# Patient Record
Sex: Female | Born: 1945 | Race: Black or African American | Hispanic: No | Marital: Single | Smoking: Never smoker
Health system: Southern US, Community
[De-identification: ages and names within clinical notes are randomized; demographics above are authoritative.]

## PROBLEM LIST (undated history)

## (undated) DIAGNOSIS — M5030 Other cervical disc degeneration, unspecified cervical region: Secondary | ICD-10-CM

## (undated) DIAGNOSIS — I82409 Acute embolism and thrombosis of unspecified deep veins of unspecified lower extremity: Secondary | ICD-10-CM

## (undated) DIAGNOSIS — M329 Systemic lupus erythematosus, unspecified: Secondary | ICD-10-CM

## (undated) DIAGNOSIS — M4800 Spinal stenosis, site unspecified: Secondary | ICD-10-CM

## (undated) DIAGNOSIS — M797 Fibromyalgia: Secondary | ICD-10-CM

## (undated) DIAGNOSIS — I10 Essential (primary) hypertension: Secondary | ICD-10-CM

## (undated) DIAGNOSIS — IMO0002 Reserved for concepts with insufficient information to code with codable children: Secondary | ICD-10-CM

## (undated) DIAGNOSIS — G8929 Other chronic pain: Secondary | ICD-10-CM

## (undated) DIAGNOSIS — M4820 Kissing spine, site unspecified: Secondary | ICD-10-CM

---

## 2001-09-27 ENCOUNTER — Emergency Department (HOSPITAL_COMMUNITY): Admission: EM | Admit: 2001-09-27 | Discharge: 2001-09-27 | Payer: Self-pay | Admitting: Emergency Medicine

## 2019-06-23 ENCOUNTER — Ambulatory Visit: Payer: Self-pay

## 2019-07-01 ENCOUNTER — Ambulatory Visit: Payer: Medicare Other | Attending: Internal Medicine

## 2019-07-01 DIAGNOSIS — Z23 Encounter for immunization: Secondary | ICD-10-CM

## 2019-07-01 NOTE — Progress Notes (Signed)
   Covid-19 Vaccination Clinic  Name:  Alexandra James    MRN: [id number] DOB: March 06, 1946  07/01/2019  Alexandra James was observed post Covid-19 immunization for 15 minutes without incidence. She was provided with Vaccine Information Sheet and instruction to access the V-Safe system.   Alexandra James was instructed to call 911 with any severe reactions post vaccine: Marland Kitchen Difficulty breathing  . Swelling of your face and throat  . A fast heartbeat  . A bad rash all over your body  . Dizziness and weakness    Immunizations Administered    Name Date Dose VIS Date Route   Pfizer COVID-19 Vaccine 07/01/2019  2:51 PM 0.3 mL 05/05/2019 Intramuscular   Manufacturer: [employer], [employer]   Lot: [id number]   NDC: [address]-5

## 2019-07-14 ENCOUNTER — Ambulatory Visit: Payer: Self-pay

## 2019-07-26 ENCOUNTER — Ambulatory Visit: Payer: Medicare Other | Attending: Internal Medicine

## 2019-07-26 DIAGNOSIS — Z23 Encounter for immunization: Secondary | ICD-10-CM | POA: Insufficient documentation

## 2019-07-26 NOTE — Progress Notes (Signed)
   Covid-19 Vaccination Clinic  Name:  Alexandra James    MRN: [id number] DOB: April 09, 1946  07/26/2019  [name] was observed post Covid-19 immunization for 15 minutes without incident. She was provided with Vaccine Information Sheet and instruction to access the V-Safe system.   [name] was instructed to call 911 with any severe reactions post vaccine: Marland Kitchen Difficulty breathing  . Swelling of face and throat  . A fast heartbeat  . A bad rash all over body  . Dizziness and weakness   Immunizations Administered    Name Date Dose VIS Date Route   Pfizer COVID-19 Vaccine 07/26/2019 10:51 AM 0.3 mL 05/05/2019 Intramuscular   Manufacturer: [employer], [employer]   Lot: [id number]   NDC: [address]-1

## 2020-09-06 ENCOUNTER — Emergency Department (HOSPITAL_BASED_OUTPATIENT_CLINIC_OR_DEPARTMENT_OTHER): Payer: Medicare Other | Admitting: Radiology

## 2020-09-06 ENCOUNTER — Emergency Department (HOSPITAL_BASED_OUTPATIENT_CLINIC_OR_DEPARTMENT_OTHER): Payer: Medicare Other

## 2020-09-06 ENCOUNTER — Other Ambulatory Visit: Payer: Self-pay

## 2020-09-06 ENCOUNTER — Encounter (HOSPITAL_BASED_OUTPATIENT_CLINIC_OR_DEPARTMENT_OTHER): Payer: Self-pay | Admitting: Emergency Medicine

## 2020-09-06 ENCOUNTER — Emergency Department (HOSPITAL_BASED_OUTPATIENT_CLINIC_OR_DEPARTMENT_OTHER)
Admission: EM | Admit: 2020-09-06 | Discharge: 2020-09-07 | Disposition: A | Discharge status: Home / Self Care | Payer: Medicare Other | Attending: Emergency Medicine | Admitting: Emergency Medicine

## 2020-09-06 DIAGNOSIS — I10 Essential (primary) hypertension: Secondary | ICD-10-CM | POA: Insufficient documentation

## 2020-09-06 DIAGNOSIS — W07XXXA Fall from chair, initial encounter: Secondary | ICD-10-CM | POA: Diagnosis not present

## 2020-09-06 DIAGNOSIS — M797 Fibromyalgia: Secondary | ICD-10-CM | POA: Diagnosis not present

## 2020-09-06 DIAGNOSIS — M79651 Pain in right thigh: Secondary | ICD-10-CM | POA: Insufficient documentation

## 2020-09-06 DIAGNOSIS — R0789 Other chest pain: Secondary | ICD-10-CM | POA: Diagnosis not present

## 2020-09-06 DIAGNOSIS — M545 Low back pain, unspecified: Secondary | ICD-10-CM | POA: Diagnosis not present

## 2020-09-06 DIAGNOSIS — W19XXXA Unspecified fall, initial encounter: Secondary | ICD-10-CM

## 2020-09-06 DIAGNOSIS — R600 Localized edema: Secondary | ICD-10-CM | POA: Insufficient documentation

## 2020-09-06 LAB — BASIC METABOLIC PANEL
Anion gap: 10 (ref 5–15)
BUN: 33 mg/dL — ABNORMAL HIGH (ref 8–23)
CO2: 28 mmol/L (ref 22–32)
Calcium: 9.6 mg/dL (ref 8.9–10.3)
Chloride: 103 mmol/L (ref 98–111)
Creatinine, Ser: 1.64 mg/dL — ABNORMAL HIGH (ref 0.44–1.00)
GFR, Estimated: 33 mL/min — ABNORMAL LOW (ref >60)
Glucose, Bld: 90 mg/dL (ref 70–99)
Potassium: 3.6 mmol/L (ref 3.5–5.1)
Sodium: 141 mmol/L (ref 135–145)

## 2020-09-06 LAB — CBC
HCT: 36.9 % (ref 36.0–46.0)
Hemoglobin: 12.3 g/dL (ref 12.0–15.0)
MCH: 29.1 pg (ref 26.0–34.0)
MCHC: 33.3 g/dL (ref 30.0–36.0)
MCV: 87.4 fL (ref 80.0–100.0)
Platelets: 146 10*3/uL — ABNORMAL LOW (ref 150–400)
RBC: 4.22 MIL/uL (ref 3.87–5.11)
RDW: 14.7 % (ref 11.5–15.5)
WBC: 5.2 10*3/uL (ref 4.0–10.5)
nRBC: 0 % (ref 0.0–0.2)

## 2020-09-06 LAB — PROTIME-INR
INR: 1.1 (ref 0.8–1.2)
Prothrombin Time: 14 seconds (ref 11.4–15.2)

## 2020-09-06 LAB — TROPONIN I (HIGH SENSITIVITY): Troponin I (High Sensitivity): 3 ng/L (ref <18)

## 2020-09-06 LAB — CBG MONITORING, ED: Glucose-Capillary: 85 mg/dL (ref 70–99)

## 2020-09-06 MED ORDER — ASPIRIN 81 MG PO CHEW
324.0000 mg | CHEWABLE_TABLET | Freq: Once | ORAL | Status: AC
  Administered 2020-09-06: 324 mg via ORAL
  Filled 2020-09-06: qty 4

## 2020-09-06 MED ORDER — HYDROCODONE-ACETAMINOPHEN 5-325 MG PO TABS
1.0000 | ORAL_TABLET | Freq: Once | ORAL | Status: AC
  Administered 2020-09-06: 1 via ORAL
  Filled 2020-09-06: qty 1

## 2020-09-06 NOTE — ED Provider Notes (Signed)
Care of the patient assumed at the change of shift. She is her after a fall, also having an uneasy feeling in her chest. Low back pain after the fall, now also complaining of R thigh pain, worse with movement Physical Exam  BP (!) 114/47 (BP Location: Left Arm)   Pulse 72   Temp 98.1 F (36.7 C) (Oral)   Resp 17   Wt 100 kg   SpO2 100%   Physical Exam FROM of R leg, no bony tenderness.  ED Course/Procedures     Procedures  MDM  L spine and CXR neg for acute process or injury. Will give a dose of pain medications while awaiting delta Trop  12:39 AM Patient reports pain improved. Delta Trop is neg. Plan discharge with outpatient follow up. APAP if needed for pain.        Pollyann Savoy, MD 09/07/20 0040

## 2020-09-06 NOTE — ED Notes (Signed)
Patient transported to X-ray 

## 2020-09-06 NOTE — ED Triage Notes (Signed)
Pt fell while attempting to sit in her chair and it slipped backwards. Pt is c/o back pain and left sided chest "uneasiness". Pt does not think it his her heart but more of her rib cage.   Rates back pain 1/10. Denies LOC or hitting her head.

## 2020-09-06 NOTE — ED Notes (Signed)
Pt is c/o right leg pain, will notify MD.

## 2020-09-06 NOTE — ED Provider Notes (Signed)
[facility] EMERGENCY DEPT Provider Note   CSN: [id number] Arrival date & time: 09/06/20  2151     History Chief Complaint  Patient presents with  . Fall  . Chest Pain    Alexandra James is a 75 y.o. female.  HPI   Patient states she was attempting to sit in a chair when it slipped backwards and she ended up falling.  Patient states after the fall she started having some pain in her lower back.  She also has had an uneasy sensation in her chest.  Patient does not describe it as a pain.  She is not feeling short of breath.  She gestures with her hand a pinching twisting type of motion.  She did not hit her head.  She did not lose consciousness.  With the discomfort in her chest she figured she should come and get checked out.  Patient does not have a history of heart disease but she does have a history of lupus.  She also does have history of some chronic leg swelling  Past medical history: Chronic pain, degenerative disc disease, depression, DVT, fibromyalgia, hypertension, lupus, spinal stenosis    History reviewed. No pertinent surgical history.   OB History   No obstetric history on file.     History reviewed. No pertinent family history.     Home Medications Prior to Admission medications   Not on File    Allergies    Clindamycin/lincomycin, Penicillins, and Amoxicillin  Review of Systems   Review of Systems  All other systems reviewed and are negative.   Physical Exam Updated Vital Signs BP 137/79   Pulse 79   Temp 98.1 F (36.7 C) (Oral)   Resp 18   Wt 100 kg   SpO2 100%   Physical Exam Vitals and nursing note reviewed.  Constitutional:      General: She is not in acute distress.    Appearance: She is well-developed.  HENT:     Head: Normocephalic and atraumatic.     Right Ear: External ear normal.     Left Ear: External ear normal.  Eyes:     General: No scleral icterus.       Right eye: No discharge.        Left eye: No  discharge.     Conjunctiva/sclera: Conjunctivae normal.  Neck:     Trachea: No tracheal deviation.  Cardiovascular:     Rate and Rhythm: Normal rate and regular rhythm.  Pulmonary:     Effort: Pulmonary effort is normal. No respiratory distress.     Breath sounds: Normal breath sounds. No stridor. No wheezing or rales.  Abdominal:     General: Bowel sounds are normal. There is no distension.     Palpations: Abdomen is soft.     Tenderness: There is no abdominal tenderness. There is no guarding or rebound.  Musculoskeletal:     Cervical back: Normal and neck supple.     Thoracic back: Normal.     Lumbar back: Tenderness present.     Right lower leg: No tenderness. Edema present.     Left lower leg: No tenderness. Edema present.  Skin:    General: Skin is warm and dry.     Findings: No rash.  Neurological:     Mental Status: She is alert.     Cranial Nerves: No cranial nerve deficit (no facial droop, extraocular movements intact, no slurred speech).     Sensory: No sensory deficit.  Motor: No abnormal muscle tone or seizure activity.     Coordination: Coordination normal.     ED Results / Procedures / Treatments   Labs (all labs ordered are listed, but only abnormal results are displayed) Labs Reviewed  BASIC METABOLIC PANEL  CBC  CBG MONITORING, ED  TROPONIN I (HIGH SENSITIVITY)    EKG None  Radiology No results found.  Procedures Procedures   Medications Ordered in ED Medications  aspirin chewable tablet 324 mg (has no administration in time range)    ED Course  I have reviewed the triage vital signs and the nursing notes.  Pertinent labs & imaging results that were available during my care of the patient were reviewed by me and considered in my medical decision making (see chart for details).    MDM Rules/Calculators/A&P                          Pt with complaints of fall, uneasy feeling in chest.  Sx atypical for ACS.  No signs of acute infection.   Mild ttp on exam.  Labs xrays ordered.   Care turned over to Dr Bernette Mayers at shift change.   Linwood Dibbles, MD 09/08/20 [phone]

## 2020-09-07 LAB — TROPONIN I (HIGH SENSITIVITY): Troponin I (High Sensitivity): 4 ng/L (ref <18)

## 2021-05-09 ENCOUNTER — Emergency Department (HOSPITAL_BASED_OUTPATIENT_CLINIC_OR_DEPARTMENT_OTHER)
Admission: EM | Admit: 2021-05-09 | Discharge: 2021-05-09 | Disposition: A | Discharge status: Home / Self Care | Payer: Medicare Other | Attending: Emergency Medicine | Admitting: Emergency Medicine

## 2021-05-09 ENCOUNTER — Emergency Department (HOSPITAL_BASED_OUTPATIENT_CLINIC_OR_DEPARTMENT_OTHER): Payer: Medicare Other | Admitting: Radiology

## 2021-05-09 ENCOUNTER — Other Ambulatory Visit: Payer: Self-pay

## 2021-05-09 ENCOUNTER — Encounter (HOSPITAL_BASED_OUTPATIENT_CLINIC_OR_DEPARTMENT_OTHER): Payer: Self-pay | Admitting: *Deleted

## 2021-05-09 DIAGNOSIS — I10 Essential (primary) hypertension: Secondary | ICD-10-CM | POA: Diagnosis not present

## 2021-05-09 DIAGNOSIS — M25512 Pain in left shoulder: Secondary | ICD-10-CM | POA: Diagnosis not present

## 2021-05-09 DIAGNOSIS — M791 Myalgia, unspecified site: Secondary | ICD-10-CM | POA: Insufficient documentation

## 2021-05-09 DIAGNOSIS — R519 Headache, unspecified: Secondary | ICD-10-CM | POA: Diagnosis not present

## 2021-05-09 DIAGNOSIS — R0789 Other chest pain: Secondary | ICD-10-CM | POA: Diagnosis not present

## 2021-05-09 DIAGNOSIS — M542 Cervicalgia: Secondary | ICD-10-CM | POA: Diagnosis not present

## 2021-05-09 DIAGNOSIS — N289 Disorder of kidney and ureter, unspecified: Secondary | ICD-10-CM | POA: Insufficient documentation

## 2021-05-09 DIAGNOSIS — R079 Chest pain, unspecified: Secondary | ICD-10-CM | POA: Diagnosis present

## 2021-05-09 HISTORY — DX: Essential (primary) hypertension: I10

## 2021-05-09 HISTORY — DX: Fibromyalgia: M79.7

## 2021-05-09 HISTORY — DX: Systemic lupus erythematosus, unspecified: M32.9

## 2021-05-09 HISTORY — DX: Other chronic pain: G89.29

## 2021-05-09 HISTORY — DX: Acute embolism and thrombosis of unspecified deep veins of unspecified lower extremity: I82.409

## 2021-05-09 HISTORY — DX: Reserved for concepts with insufficient information to code with codable children: IMO0002

## 2021-05-09 HISTORY — DX: Kissing spine, site unspecified: M48.20

## 2021-05-09 HISTORY — DX: Other cervical disc degeneration, unspecified cervical region: M50.30

## 2021-05-09 HISTORY — DX: Spinal stenosis, site unspecified: M48.00

## 2021-05-09 LAB — CBC WITH DIFFERENTIAL/PLATELET
Abs Immature Granulocytes: 0.03 10*3/uL (ref 0.00–0.07)
Basophils Absolute: 0 10*3/uL (ref 0.0–0.1)
Basophils Relative: 0 %
Eosinophils Absolute: 0 10*3/uL (ref 0.0–0.5)
Eosinophils Relative: 0 %
HCT: 40.3 % (ref 36.0–46.0)
Hemoglobin: 13 g/dL (ref 12.0–15.0)
Immature Granulocytes: 1 %
Lymphocytes Relative: 16 %
Lymphs Abs: 1 10*3/uL (ref 0.7–4.0)
MCH: 28.6 pg (ref 26.0–34.0)
MCHC: 32.3 g/dL (ref 30.0–36.0)
MCV: 88.6 fL (ref 80.0–100.0)
Monocytes Absolute: 0.5 10*3/uL (ref 0.1–1.0)
Monocytes Relative: 8 %
Neutro Abs: 4.7 10*3/uL (ref 1.7–7.7)
Neutrophils Relative %: 75 %
Platelets: 184 10*3/uL (ref 150–400)
RBC: 4.55 MIL/uL (ref 3.87–5.11)
RDW: 14.4 % (ref 11.5–15.5)
WBC: 6.3 10*3/uL (ref 4.0–10.5)
nRBC: 0 % (ref 0.0–0.2)

## 2021-05-09 LAB — COMPREHENSIVE METABOLIC PANEL
ALT: 18 U/L (ref 0–44)
AST: 19 U/L (ref 15–41)
Albumin: 4.3 g/dL (ref 3.5–5.0)
Alkaline Phosphatase: 105 U/L (ref 38–126)
Anion gap: 10 (ref 5–15)
BUN: 22 mg/dL (ref 8–23)
CO2: 29 mmol/L (ref 22–32)
Calcium: 9.9 mg/dL (ref 8.9–10.3)
Chloride: 101 mmol/L (ref 98–111)
Creatinine, Ser: 1.39 mg/dL — ABNORMAL HIGH (ref 0.44–1.00)
GFR, Estimated: 40 mL/min — ABNORMAL LOW (ref >60)
Glucose, Bld: 149 mg/dL — ABNORMAL HIGH (ref 70–99)
Potassium: 3.8 mmol/L (ref 3.5–5.1)
Sodium: 140 mmol/L (ref 135–145)
Total Bilirubin: 0.8 mg/dL (ref 0.3–1.2)
Total Protein: 7.9 g/dL (ref 6.5–8.1)

## 2021-05-09 LAB — TROPONIN I (HIGH SENSITIVITY)
Troponin I (High Sensitivity): 3 ng/L (ref <18)
Troponin I (High Sensitivity): 3 ng/L (ref <18)

## 2021-05-09 LAB — LIPASE, BLOOD: Lipase: 15 U/L (ref 11–51)

## 2021-05-09 MED ORDER — DIPHENHYDRAMINE HCL 50 MG/ML IJ SOLN
25.0000 mg | Freq: Once | INTRAMUSCULAR | Status: AC
  Administered 2021-05-09: 25 mg via INTRAVENOUS
  Filled 2021-05-09: qty 1

## 2021-05-09 MED ORDER — LACTATED RINGERS IV BOLUS
1000.0000 mL | Freq: Once | INTRAVENOUS | Status: AC
  Administered 2021-05-09: 1000 mL via INTRAVENOUS

## 2021-05-09 MED ORDER — ASPIRIN 81 MG PO CHEW
324.0000 mg | CHEWABLE_TABLET | Freq: Once | ORAL | Status: AC
  Administered 2021-05-09: 324 mg via ORAL
  Filled 2021-05-09: qty 4

## 2021-05-09 MED ORDER — PROCHLORPERAZINE EDISYLATE 10 MG/2ML IJ SOLN
10.0000 mg | Freq: Once | INTRAMUSCULAR | Status: AC
  Administered 2021-05-09: 10 mg via INTRAVENOUS
  Filled 2021-05-09: qty 2

## 2021-05-09 NOTE — ED Triage Notes (Signed)
Pt reports left "stiff neck", headache, left shoulder blade, back and "like a band" around the chest discomfort. Reports pain started last night before going to bed.

## 2021-05-09 NOTE — Discharge Instructions (Signed)
Take acetaminophen and/or ibuprofen as needed for pain.  Make sure to drink enough fluids.  Return to the emergency department if you are having any problems.

## 2021-05-09 NOTE — ED Notes (Signed)
After triage, pt says she is feeling like needs to vomit. Gave her emesis bag, pt vomited approx emesis. Provider at the bedside.

## 2021-05-09 NOTE — ED Provider Notes (Signed)
[facility] EMERGENCY DEPT Provider Note   CSN: [id number] Arrival date & time: 05/09/21  0401     History Chief Complaint  Patient presents with   Chest Pain    Alexandra James is a 75 y.o. female.  The history is provided by the patient.  Chest Pain She has history of hypertension, lupus and comes in because of chest pain as well as neck and shoulder pain and headache.  She was feeling well until this evening when she just had general malaise.  She thought she may have had some indigestion because she had over eaten.  She then gradually developed a global headache, has some pain in her neck and left shoulder and a tight feeling in her chest.  There is no associated dyspnea or diaphoresis.  After arrival in the ED, she had nausea and vomited a large amount.  Following this, she is feeling somewhat better.  Pain was rated at 8/10 at its worst, 5/10 currently.  She has never had anything like this.  She is a non-smoker and without a history of diabetes or hyperlipidemia.   Past Medical History:  Diagnosis Date   Baastrup's syndrome    DDD (degenerative disc disease), cervical    DVT (deep venous thrombosis) (HCC)    Hypertension    Lupus (HCC)    Spinal stenosis     There are no problems to display for this patient.   History reviewed. No pertinent surgical history.   OB History   No obstetric history on file.     No family history on file.     Home Medications Prior to Admission medications   Not on File    Allergies    Clindamycin/lincomycin, Penicillins, and Amoxicillin  Review of Systems   Review of Systems  Cardiovascular:  Positive for chest pain.  All other systems reviewed and are negative.  Physical Exam Updated Vital Signs BP (!) 143/69 (BP Location: Right Arm)    Pulse 76    Temp 97.6 F (36.4 C) (Oral)    Resp (!) 22    Ht 5\' 4"  (1.626 m)    Wt 99.8 kg    SpO2 100%    BMI 37.76 kg/m   Physical Exam Vitals and nursing note  reviewed.  75 year old female, resting comfortably and in no acute distress. Vital signs are significant for slightly elevated respiratory rate, and borderline elevated blood pressure. Oxygen saturation is 100%, which is normal. Head is normocephalic and atraumatic. PERRLA, EOMI. Oropharynx is clear. Neck is nontender and supple without adenopathy or JVD. Back is nontender and there is no CVA tenderness. Lungs are clear without rales, wheezes, or rhonchi. Chest is nontender. Heart has regular rate and rhythm without murmur. Abdomen is soft, flat, nontender without masses or hepatosplenomegaly and peristalsis is hypoactive. Extremities have 1+ edema, full range of motion is present. Skin is warm and dry without rash. Neurologic: Mental status is normal, cranial nerves are intact, moves all extremities equally.  ED Results / Procedures / Treatments   Labs (all labs ordered are listed, but only abnormal results are displayed) Labs Reviewed  COMPREHENSIVE METABOLIC PANEL - Abnormal; Notable for the following components:      Result Value   Glucose, Bld 149 (*)    Creatinine, Ser 1.39 (*)    GFR, Estimated 40 (*)    All other components within normal limits  LIPASE, BLOOD  CBC WITH DIFFERENTIAL/PLATELET  TROPONIN I (HIGH SENSITIVITY)  TROPONIN I (HIGH SENSITIVITY)  EKG EKG Interpretation  Date/Time:  Friday May 09 2021 04:14:43 EST Ventricular Rate:  78 PR Interval:  219 QRS Duration: 101 QT Interval:  431 QTC Calculation: 491 R Axis:   -45 Text Interpretation: Sinus rhythm Borderline prolonged PR interval Left anterior fascicular block Low voltage, precordial leads Consider anterior infarct When compared with ECG of 09/06/2020, No significant change was found Confirmed by Dione Booze ([id number]) on 05/09/2021 4:23:18 AM  Radiology DG Chest 2 View  Result Date: 05/09/2021 CLINICAL DATA:  75 year old female with history of chest pain. EXAM: CHEST - 2 VIEW COMPARISON:  Chest  x-ray 09/06/2020. FINDINGS: Lung volumes are low. There are bibasilar opacities (left greater than right), most compatible with areas of subsegmental atelectasis. No acute consolidative airspace disease. No definite pleural effusions. No pneumothorax. No evidence of pulmonary edema. Heart size is normal. The patient is rotated to the left on today's exam, resulting in distortion of the mediastinal contours and reduced diagnostic sensitivity and specificity for mediastinal pathology. IMPRESSION: 1. Low lung volumes with bibasilar (left greater than right) areas of subsegmental atelectasis. Electronically Signed   By: Trudie Reed M.D.   On: 05/09/2021 05:08    Procedures Procedures   Medications Ordered in ED Medications  lactated ringers bolus 1,000 mL (0 mLs Intravenous Stopped 05/09/21 0602)  prochlorperazine (COMPAZINE) injection 10 mg (10 mg Intravenous Given 05/09/21 0501)  diphenhydrAMINE (BENADRYL) injection 25 mg (25 mg Intravenous Given 05/09/21 0505)  aspirin chewable tablet 324 mg (324 mg Oral Given 05/09/21 0505)    ED Course  I have reviewed the triage vital signs and the nursing notes.  Pertinent labs & imaging results that were available during my care of the patient were reviewed by me and considered in my medical decision making (see chart for details).   MDM Rules/Calculators/A&P                         Chest pain as well as associated neck pain and shoulder pain and headache.  Chest pain is concerning for possible angina or ACS, but other symptoms are not typical of ACS.  ECG shows no acute changes.  Old records are reviewed, and she has no relevant past visits.  We will check chest x-ray and screening labs.  She will be given IV fluids, prochlorperazine, diphenhydramine as well as aspirin and reassessed.  Patient risk score per heart pathway is for which puts her at an elevated risk of major adverse cardiac events, but not a high risk.  Chest x-ray shows probable  atelectasis.  Labs are significant for renal insufficiency which is actually improved compared with baseline.  Following above-noted treatment, she had complete relief of symptoms.  Troponin is normal x2, she is felt to be safe for discharge with follow-up with primary care provider.  Final Clinical Impression(s) / ED Diagnoses Final diagnoses:  Bad headache  Myalgia  Renal insufficiency    Rx / DC Orders ED Discharge Orders     None        Dione Booze, MD 05/09/21 [id number]

## 2021-05-09 NOTE — ED Notes (Signed)
Patient transported to X-ray 

## 2021-09-03 IMAGING — DX DG LUMBAR SPINE COMPLETE 4+V
5 series · 5 of 5 positions shown · non-contrast
Comparison: None.

CLINICAL DATA: Recent fall with left-sided back and chest pain,
initial encounter

EXAM:
LUMBAR SPINE - COMPLETE 4+ VIEW

[l-spine ap]
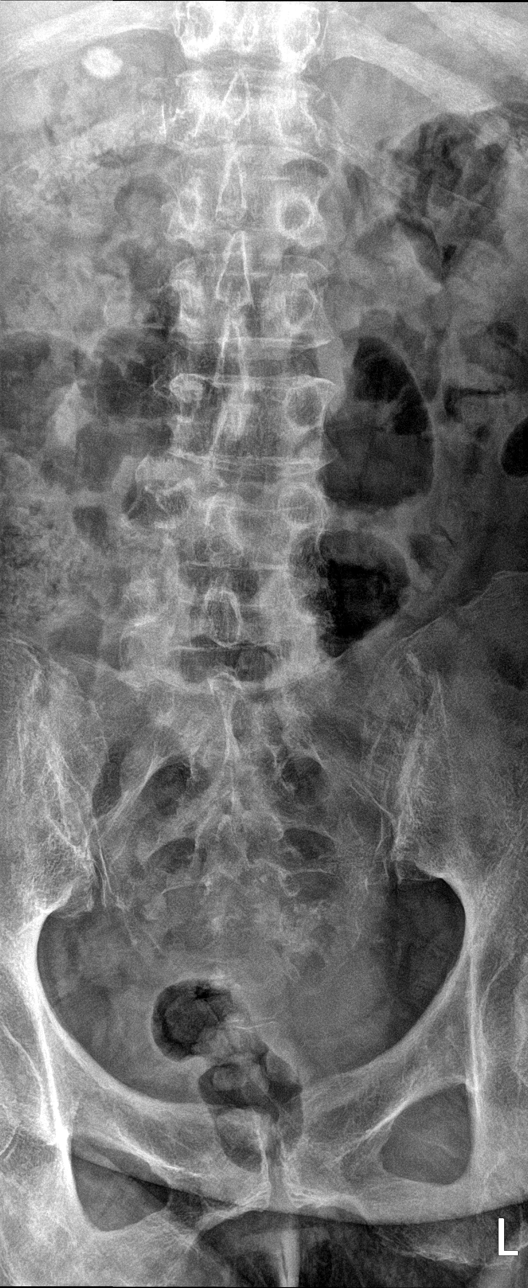

[l-spine obl (1 of 2)]
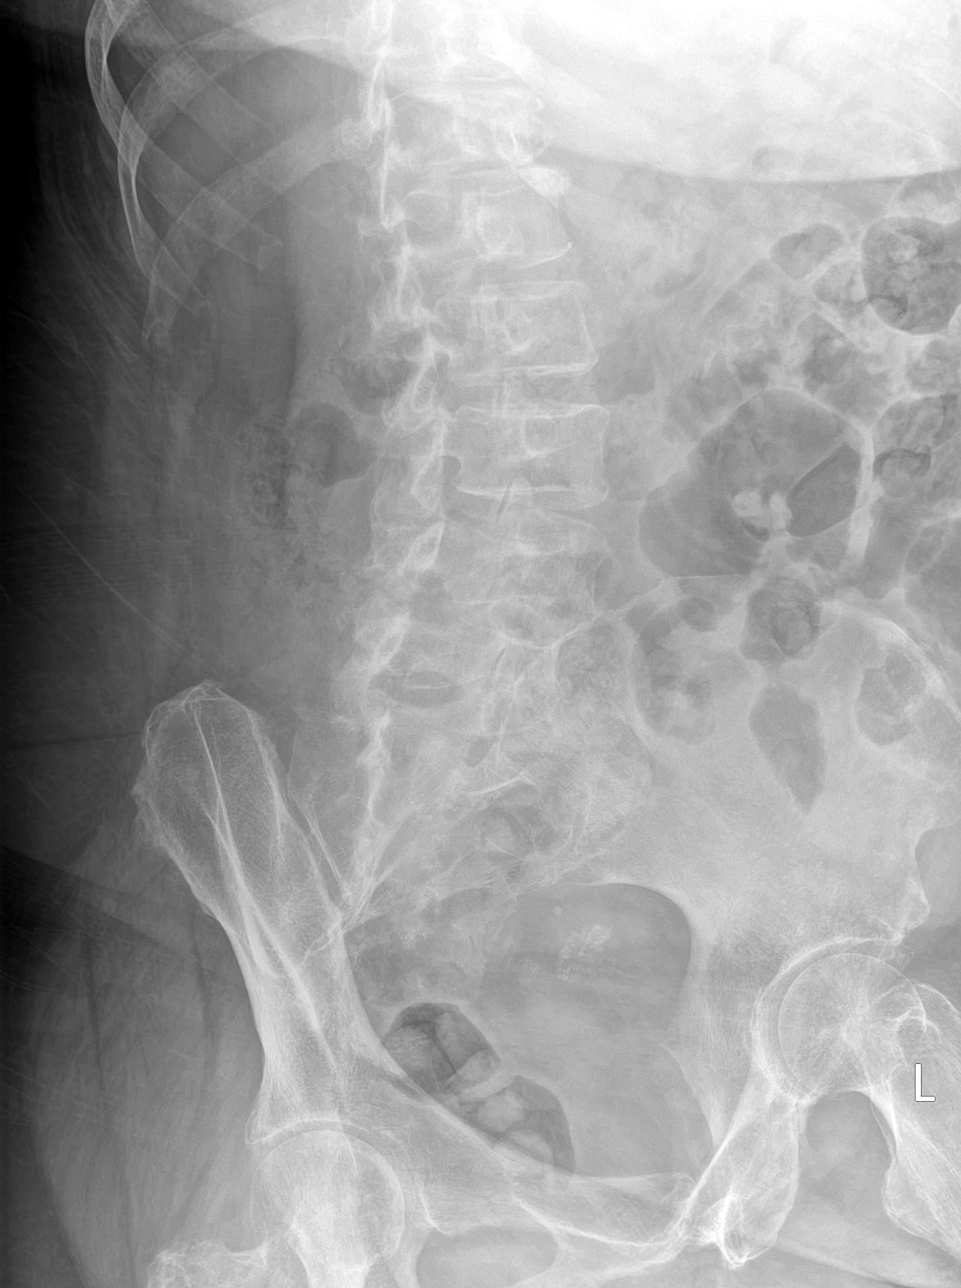

[l-spine obl (2 of 2)]
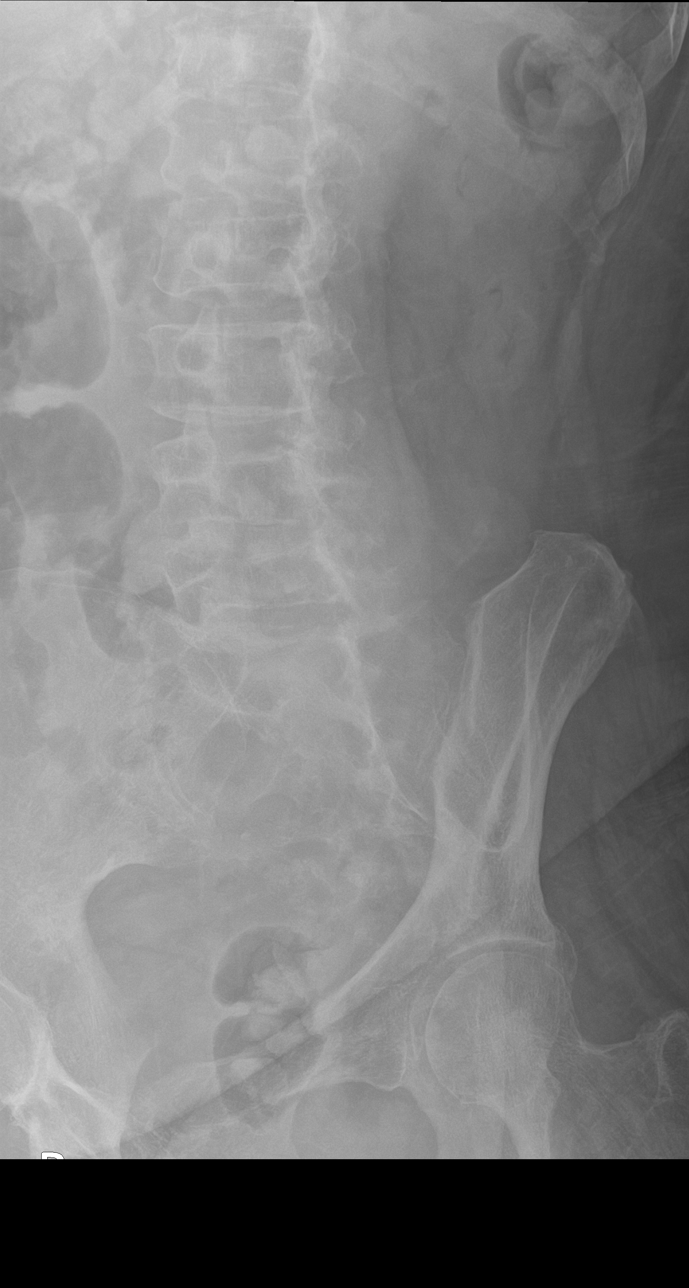

[l-spine lat (1 of 2)]
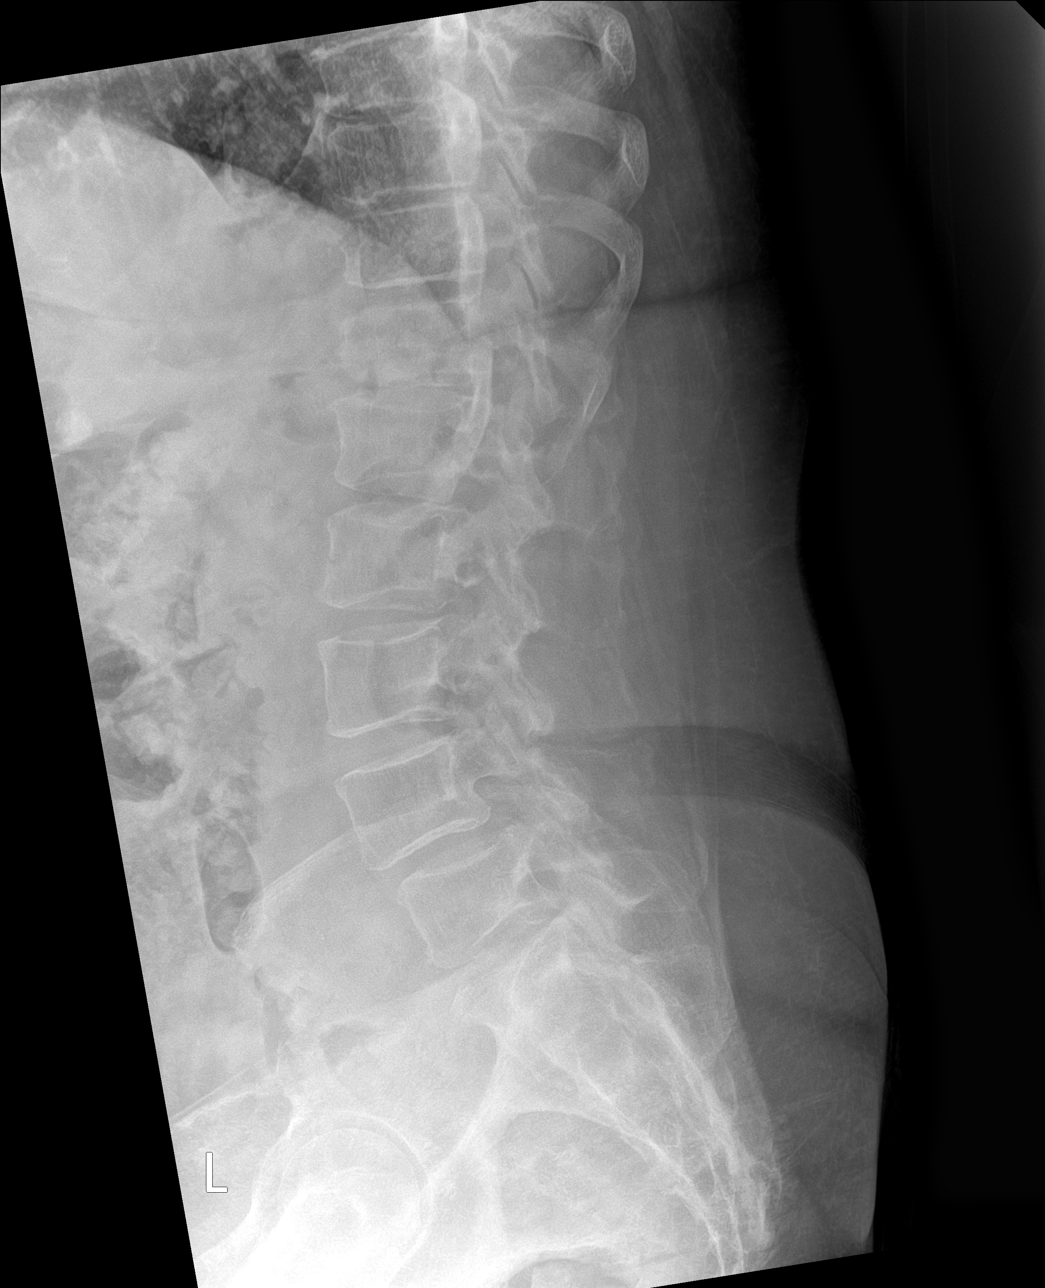

[l-spine lat (2 of 2)]
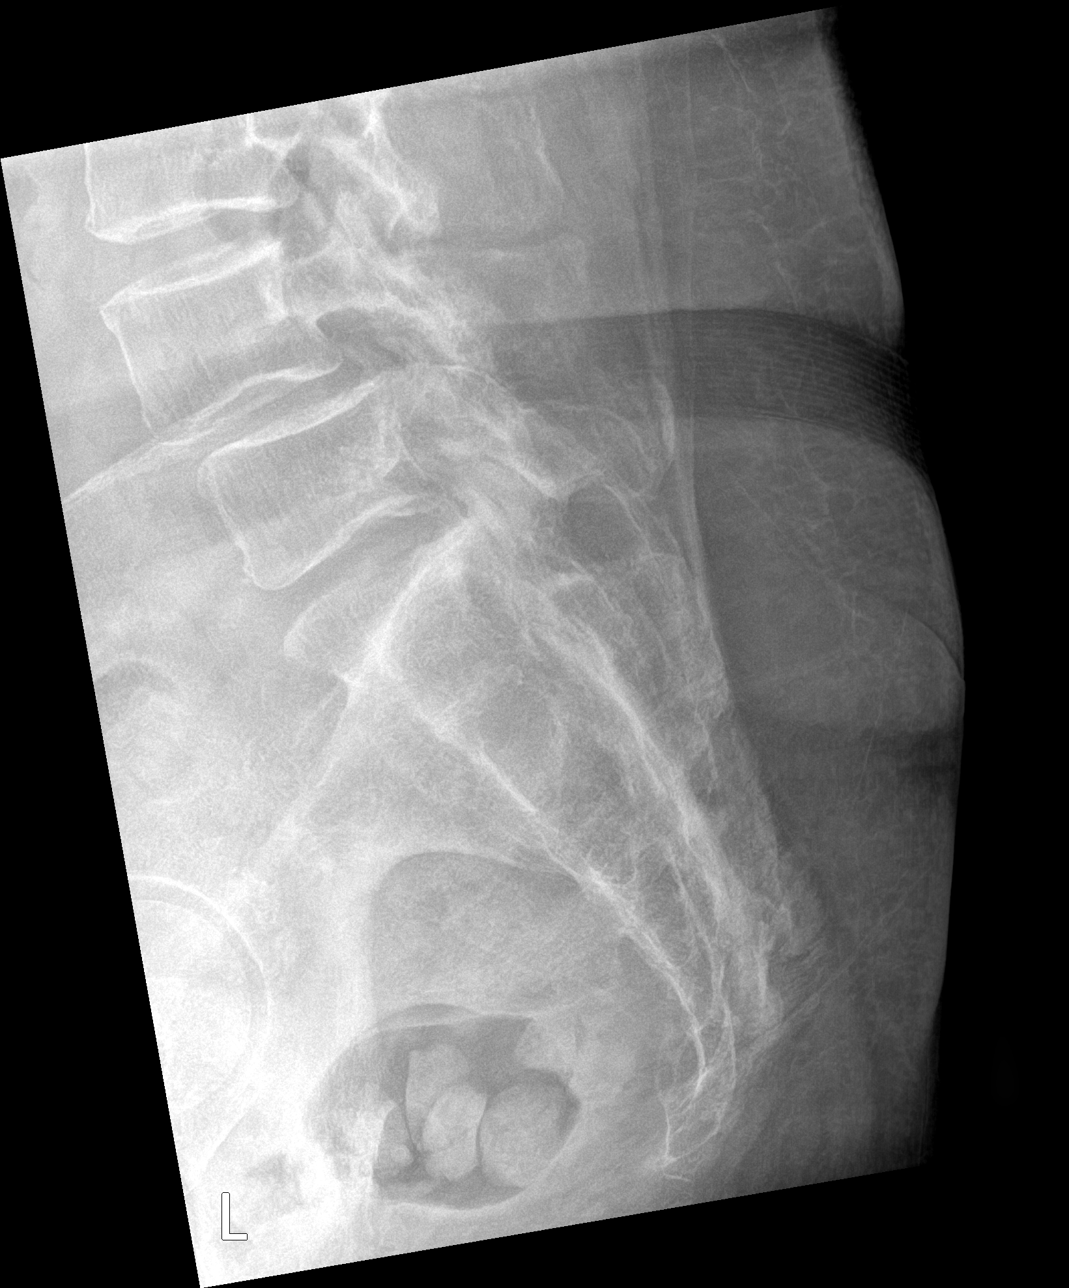

[5 of 5 positions shown; findings below may reference images not displayed]

FINDINGS: Five lumbar type vertebral bodies are well visualized. Vertebral
body height is well maintained. Degenerative anterolisthesis of L4
on L5 is noted. No pars defects are noted. No soft tissue
abnormality is seen.
IMPRESSION: Degenerative change without acute abnormality.

## 2022-10-02 ENCOUNTER — Telehealth: Payer: Self-pay | Admitting: *Deleted

## 2022-10-02 NOTE — Telephone Encounter (Signed)
Karie Mainland [phone], calling to find out status of referral?

## 2022-10-05 NOTE — Telephone Encounter (Signed)
LMOM to let Monique know that the referral was declined by Dr. Corliss Skains & Dr. Dimple Casey.

## 2022-11-05 ENCOUNTER — Encounter: Payer: Self-pay | Admitting: Physical Therapy

## 2022-11-05 ENCOUNTER — Ambulatory Visit: Payer: HMO | Attending: Registered Nurse | Admitting: Physical Therapy

## 2022-11-05 DIAGNOSIS — M5136 Other intervertebral disc degeneration, lumbar region: Secondary | ICD-10-CM | POA: Insufficient documentation

## 2022-11-05 DIAGNOSIS — R269 Unspecified abnormalities of gait and mobility: Secondary | ICD-10-CM | POA: Diagnosis present

## 2022-11-05 NOTE — Therapy (Signed)
OUTPATIENT PHYSICAL THERAPY WHEELCHAIR EVALUATION   Patient Name: Alexandra James MRN: [id number] DOB:September 02, 1945, 77 y.o., female Today's Date: 11/09/2022  END OF SESSION:    11/05/22 1408  PT Visits / Re-Eval  Visit Number 1  Number of Visits 1  Authorization  Authorization Type [employer]  PT Time Calculation  PT Start Time 1408  PT Stop Time 1455  PT Time Calculation (min) 47 min  PT - End of Session  Equipment Utilized During Treatment Gait belt  Activity Tolerance Patient tolerated treatment well  Behavior During Therapy WFL for tasks assessed/performed    Past Medical History:  Diagnosis Date   Baastrup's syndrome    Chronic pain    DDD (degenerative disc disease), cervical    DVT (deep venous thrombosis) (HCC)    Fibromyalgia    Hypertension    Lupus (HCC)    Spinal stenosis    Past Surgical History:  Procedure Laterality Date   CESAREAN SECTION     There are no problems to display for this patient.   PCP: Cyril Mourning, FNP  REFERRING PROVIDER: Cyril Mourning, FNP  THERAPY DIAG:  Abnormality of gait and mobility  Rationale for Evaluation and Treatment Rehabilitation  SUBJECTIVE:                                                                                                                                                                                           SUBJECTIVE STATEMENT: Pt presents for wheelchair evaluation with daughter and granddaughter. Patient arrives to session in borrowed transport chair propelled by daughter. Patient reports that she is currently using a rollator to get around but has not been able to make any efficient progress getting around her house and is limited by pain and weakness. Patient is mostly limited to sitting in one spot for most of the day due to difficulty getting around.   PRECAUTIONS: Fall  WEIGHT BEARING RESTRICTIONS No   OCCUPATION: Not working  PLOF:  Requires assistive device for  independence, Needs assistance with ADLs, and Needs assistance with homemaking  PATIENT GOALS: "Feel more independent and be able to go the mail box."    MEDICAL HISTORY:  Primary diagnosis onset: 08/25/2022 (referral date) Diagnosis  Code: M51.36  Diagnosis: Other intervertebral disc degeneration, lumbar region    Diagnosis code: R26.2       Diagnosis: Difficulty in walking, not elsewhere classified   Diagnosis  Code:  Diagnosis:   [] Progressive disease  Relevant future surgeries:     Height: 5\' 8"  Weight: 180 lbs Explain recent changes or trends in weight:  mild fluctuations    History:  Past Medical History:  Diagnosis Date   Baastrup's syndrome    Chronic pain    DDD (degenerative disc disease), cervical    DVT (deep venous thrombosis) (HCC)    Fibromyalgia    Hypertension    Lupus (HCC)    Spinal stenosis             Cardio Status:  Functional Limitations:   [x] Intact  []  Impaired      Respiratory Status:  Functional Limitations:   [x] Intact  [] Impaired   [] SOB [] COPD [] O2 Dependent ______LPM  [] Ventilator Dependent  Resp equip:                                                     Objective Measure(s):   Orthotics:   [] Amputee:                                                             [] Prosthesis:        HOME ENVIRONMENT:  [x] House [] Condo/town home [] Apartment [] Asst living [] LTCF         [] Own  [x] Rent   [] Lives alone [x] Lives with others -   daughter & granddaughter                      Hours without assistance: 9-10 hours   [x] Home is accessible to patient                                 Storage of wheelchair:  [x] In home   [] Other Comments:        COMMUNITY :  TRANSPORTATION:  [x] Car [] Associate Professor [] Adapted w/c Lift []  Ambulance [] Other:                     [] Sits in wheelchair during transport   Where is w/c stored during transport? van [x] [employer]  []  [employer]  r   [] Self-Driver       Drive while in  Biomedical scientist [] yes [x] no   Employment  and/or school:  Specific requirements pertaining to mobility - household mobility, transportation to/from doctor's appointments, patient would like to be able to go shopping again but has not been able to due to difficulty of getting around, patient states she would like wheelchair to be able to breakdown for transportation       Other:  COMMUNICATION:  Verbal Communication  [x] WFL [] receptive [] WFL [] expressive [] Understandable  [] Difficult to understand  [] non-communicative  Primary Language:__English____________ 2nd:_____________  Communication provided by:[x] Patient [] Family [] Caregiver [] Translator   [] Uses an augmentative communication device     Manufacturer/Model :                                                           MOBILITY/BALANCE:  Sitting Balance  Standing Balance  Transfers  Ambulation   [] WFL      [] WFL  [] Independent  []   Independent   [x] Uses UE for balance in sitting Comments:  [] Uses UE/device for stability Comments:  [x]  Min assist  []  Ambulates independently with       device:___________________      []  Mod assist  []  Able to ambulate ______ feet        safely/functionally/independently   []  Min assist  []  Min assist  []  Max assist  [x]  Non-functional ambulator         History/High risk of falls   []  Mod assist  [x]  Mod assist  []  Dependent  []  Unable to ambulate   []  Max  assist  []  Max assist  Transfer method:[] 1 person [] 2 person [] sliding board [] squat pivot [] stand pivot [] mechanical patient lift  [] other:   []  Unable  []  Unable    Fall History: # of falls in the past 6 months? Denies any # of "near" falls in the past 6 months? Patient denies any near falls but daughter denotes multiple near falls but cannot give exact number    CURRENT SEATING / MOBILITY:  Current Mobility Device: [] None [x] Cane/Walker - Rollator [] Manual [] Dependent [] Dependent w/ Tilt rScooter  [] Power (type of control):   Manufacturer: Unknown Model:  Serial #:   Size:  Color:  Age:    Purchased by whom:   Current condition of mobility base:    Current seating system:                                                                       Age of seating system:    Describe posture in present seating system:    Is the current mobility meeting medical necessity?:  [] Yes [x] No Describe: Patient extremely high falls risk, requires CGA-minA from therapist throughoutsession; patient requires use of transport chair to even make it into clinic due to lack of endurance/stability                              Ability to complete Mobility-Related Activities of Daily Living (MRADL's) with Current Mobility Device:   Move room to room  [] Independent  [x] Min [] Mod [] Max assist  [] Unable  Comments: Requires use of rollator but is non-functional and safe, family helps with all cooking/shopping, patient unable to fully dress herself and requires total A to don socks  Meal prep  [] Independent  [] Min [] Mod [] Max assist  [x] Unable    Feeding  [x] Independent  [] Min [] Mod [] Max assist  [] Unable    Bathing  [] Independent  [] Min [] Mod [x] Max assist  [] Unable    Grooming  [] Independent  [x] Min [] Mod [] Max assist  [] Unable    UE dressing  [] Independent  [x] Min [] Mod [] Max assist  [] Unable    LE dressing  [] Independent   [] Min [x] Mod [] Max assist  [] Unable    Toileting  [x] Independent  [] Min [] Mod [] Max assist  [] Unable    Bowel Mgt: [x]  Continent []  Incontinent []  Accidents []  Diapers []  Colostomy []  Bowel Program:  Bladder Mgt: []  Continent [x]  Incontinent [x]  Accidents [x]  Diapers []  Urinal []  Intermittent Cath []  Indwelling Cath []  Supra-pubic Cath     Current Mobility Equipment Trialed/ Ruled Out:    Does not meet mobility needs due to:  Mark all boxes that indicate inability to use the specific equipment listed     Meets needs for safe  independent functional  ambulation  / mobility    Risk of  Falling or History of Falls    Enviromental limitations      Cognition    Safety concerns with   physical ability    Decreased / limitations endurance  & strength     Decreased / limitations  motor skills  & coordination    Pain    Pace /  Speed    Cardiac and/or  respiratory condition    Contra - indicated by diagnosis   Cane/Crutches  []   [x]   [x]   []   [x]   [x]   [x]   [x]   [x]   []   []    Walker / Rollator  []  NA   []   [x]    [x]  []   [x]   [x]   [x]   [x]   [x]   []   []     Manual Wheelchair [id number]:  []  NA  []   []   []   []   [x]   [x]   [x]   [x]   [x]   []   []    Manual W/C (K0005) with power assist  []  NA  []   []   []   []   [x]   [x]   [x]   [x]   []   []   []    Scooter  []  NA  []   [x]   []   []   [x]   []   [x]   [x]   []   []   []    Power Wheelchair: standard joystick  []  NA  [x]   []   []   []   []   []   []   []   []   []   []    Power Wheelchair: alternative controls  []  NA  []   []   []   []   []   []   []   []   []   []   [x]    Summary:  The least costly alternative for independent functional mobility was found to be:    []  Crutch/Cane  []  Walker []  Manual w/c  []  Manual w/c with power assist   []  Scooter   [x]  Power w/c std joystick   []  Power w/c alternative control        []  Requires dependent care mobility device   Cabin crew for [employer] skills are adequate for safe mobility equipment operation  [x]   Yes []   No  Patient is willing and motivated to use recommended mobility equipment  [x]   Yes []   No       []  Patient is unable to safely operate mobility equipment independently and requires dependent care equipment Comments:           SENSATION and SKIN ISSUES:  Sensation []  Intact  [x]  Impaired []  Absent []  Hyposensate []  Hypersensate  []  Defensiveness  Location(s) of impairment: Reports numbness and weakness in bilateral UE particularly in her hands, reports intermittent numbness in bilateral LE   Pressure Relief Method(s):  [x]  Lean side to side to offload (without risk of falling)  []   W/C push up (4+ times/hour for 15+ seconds) []  Stand up (without risk of  falling)    []  Other: (Describe): Effective pressure relief method(s) above can be performed consistently throughout the day: Yes If not, Why?: NA  Skin Integrity Risk:       []  Low risk           [x]  Moderate risk            []  High risk  If high risk, explain:   Skin Issues/Skin Integrity  Current skin Issues  []  Yes [x]  No []  Intact  []   Red area   []   Open area  []  Scar tissue  []  At risk from prolonged sitting  Where: History of Skin Issues  []  Yes [x]  No Where : When: Stage: Hx of skin flap surgeries  []  Yes [x]  No Where:  When:  Pain: [x]  Yes []  No   Pain Location(s): low back and shoulders Intensity scale: (0-10) : 10/10 How does pain interfere with mobility and/or MRADLs? - Patient drags LLE increasing risk for falls due to lumbar deficits, has difficulty managing rollator due to shoulder pain, poor postural control and nearly parallel with ground due to lumbar pain decreasing awareness of surroundings likely increasing falls risk        MAT EVALUATION:  Neuro-Muscular Status: (Tone, Reflexive, Responses, etc.)     [x]   Intact   []  Spasticity:  []  Hypotonicity  []  Fluctuating  []  Muscle Spasms  []  Poor Righting Reactions/Poor Equilibrium Reactions  []  Primal Reflex(s):    Comments:       COMMENTS:    POSTURE:     Comments:  Pelvis Anterior/Posterior:  [x]  Neutral   []  Posterior  []  Anterior  []  Fixed - No movement []  Tendency away from neutral []  Flexible []  Self-correction []  External correction Obliquity (viewed from front)  [x]  WFL []  R Obliquity []  L Obliquity  []  Fixed - No movement []  Tendency away from neutral []  Flexible []  Self-correction []  External correction Rotation  [x]  WFL []  R anterior []  L anterior  []  Fixed - No movement []  Tendency away from neutral []  Flexible []  Self-correction []  External correction Tonal Influence Pelvis:  [x]  Normal []  Flaccid []  Low tone []  Spasticity []  Dystonia []  Pelvis thrust []   Other:    Trunk Anterior/Posterior:  []  WFL [x]  Thoracic kyphosis []  Lumbar lordosis  []  Fixed - No movement [x]  Tendency away from neutral [x]  Flexible [x]  Self-correction [x]  External correction, with fatigue requires external correction  [x]  WFL []  Convex to left  []  Convex to right []  S-curve   []  C-curve []  Multiple curves []  Tendency away from neutral []  Flexible []  Self-correction []  External correction Rotation of shoulders and upper trunk:  []  Neutral [x]  Left-anterior [x]  Right- anterior []  Fixed- no movement [x]  Tendency away from neutral [x]  Flexible [x]  Self correction []  External correction Tonal influence Trunk:  [x]  Normal []  Flaccid []  Low tone []  Spasticity []  Dystonia []  Other:   Head & Neck  [x]  Functional []  Flexed    []  Extended []  Rotated right  []  Rotated left []  Laterally flexed right []  Laterally flexed left []  Cervical hyperextension   [x]  Good head control []  Adequate head control []  Limited head control []  Absent head control Describe tone/movement of head and neck: grossly WNL     Lower Extremity Measurements: LE ROM:   Mild decreased knee flexion and ankle dorsiflexion on LLE   LE MMT:  MMT Right 11/09/2022 Left 11/09/2022  Hip flexion 4-/5 4-/5  Hip extension    Hip abduction 4-/5 4-/5  Hip adduction    Knee flexion 4-/5 4-/5  Knee extension 4-/5 4-/5  Ankle dorsiflexion 3/5 4-/5  Ankle plantarflexion     (Blank rows = not tested)  Hip positions:  [x]  Neutral   []  Abducted   []  Adducted  []  Subluxed   []  Dislocated   []  Fixed   []  Tendency away  from neutral [x]  Flexible []  Self-correction []  External correction   Hip Windswept:[]  Neutral  [x]  Right    []  Left  []  Subluxed   []  Dislocated   []  Fixed   []  Tendency away from neutral [x]  Flexible [x]  Self-correction []  External correction  LE Tone: [x]  Normal []  Low tone []  Spasticity []  Flaccid []  Dystonia []  Rocks/Extends at hip []  Thrust into  knee extension []  Pushes legs downward into footrest  Foot positioning: ROM Concerns: Able to be corrected with external support Dorsiflexed: []  Right   []  Left Plantar flexed: []  Right    [x]  Left Inversion: []  Right    [x]  Left Eversion: []  Right    []  Left  LE Edema: [x]  1+ (Barely detectable impression when finger is pressed into skin) []  2+ (slight indentation. 15 seconds to rebound) []  3+ (deeper indentation. 30 seconds to rebound) []  4+ (>30 seconds to rebound)  UE Measurements:  UPPER EXTREMITY ROM:   Held due to pain  UPPER EXTREMITY MMT:  Held due to pain  Shoulder Posture:  Right Tendency towards Left  []   Functional []    []   Elevation []    []   Depression []    []   Protraction []    []   Retraction []    [x]   Internal rotation [x]    []   External rotation []    []   Subluxed []     UE Tone: [x]  Normal []  Flaccid []  Low tone []  Spasticity  []  Dystonia []  Other:   UE Edema: [x]  1+ (Barely detectable impression when finger is pressed into skin) []  2+ (slight indentation. 15 seconds to rebound) []  3+ (deeper indentation. 30 seconds to rebound) []  4+ (>30 seconds to rebound)  Wrist/Hand: Handedness: [x]  Right   []  Left   []  NA: Comments:  Right  Left  []   WNL []    [x]   Limitations [x]    []   Contractures []    []   Fisting []    []   Tremors []    [x]   Weak grasp [x]    [x]   Poor dexterity [x]    []   Hand movement non functional []    []   Paralysis []         [facility] PT Assessment - 11/09/22 0001       Standardized Balance Assessment   Standardized Balance Assessment Timed Up and Go Test      Timed Up and Go Test   Normal TUG (seconds) 74   seconds with rollator (CGA, min verbal cues to safely sit back in chair)             MOBILITY BASE RECOMMENDATIONS and JUSTIFICATION:  MOBILITY BASE  JUSTIFICATION   Manufacturer:   Pride mobility Model: [employer]                             Color: black Seat Width:  18 Seat Depth:  17   []  Manual mobility  base (continue below)   []  Scooter/POV  [x]  Power mobility base   Number of hours per day spent in above selected mobility base: 12  Typical daily mobility base use Schedule: Used throughout duration of day when upright as noted above, used as primary form of mobility   [x]  is not a safe, functional ambulator  [x]  limitation prevents from completing a MRADL(s) within a reasonable time frame    [x]  limitation places at high risk of morbidity or mortality secondary to  the attempts to perform a  MRADL(s)  []  limitation prevents accomplishing a MRADL(s) entirely  [x]  provide independent mobility  [x]  equipment is a lifetime medical need  [x]  walker or cane inadequate  [x]  any type manual wheelchair      inadequate  [x]  scooter/POV inadequate      []  requires dependent mobility       POWER MOBILITY      []  Scooter/POV    []  can safely operate   []  can safely transfer   []  has adequate trunk stability   []  cannot functionally propel  manual wheelchair    [x]  Power mobility base    [x]  non-ambulatory in manner that is functionally safe [x]  cannot functionally propel manual wheelchair   [x]  cannot functionally and safely      operate scooter/POV  [x]  can safely operate power       wheelchair  [x]  home is accessible  [x]  willing to use power wheelchair     Tilt  []  Powered tilt on powered chair  []  Powered tilt on manual chair  []  Manual tilt on manual chair Comments:  []  change position for pressure      []  elief/cannot weight shift   []  change position against      gravitational force on head and      shoulders   []  decrease pain  []  blood pressure management   []  control autonomic dysreflexia  []  decrease respiratory distress  []  management of spasticity  []  management of low tone  []  facilitate postural control   []  rest periods   []  control edema  []  increase sitting tolerance   []  aid with transfers     Recline   []  Power recline on power chair  []  Manual  recline on manual chair  Comments:    []  intermittent catheterization  []  manage spasticity  []  accommodate femur to back angle  []  change position for pressure relief/cannot weight shift rhigh risk of pressure sore development  []  tilt alone does not accomplish     effective pressure relief, maximum pressure relief achieved at -      _______ degrees tilt   _______ degrees recline   []  difficult to transfer to and from bed []  rest periods and sleeping in chair  []  repositioning for transfers  []  bring to full recline for ADL care  []  clothing/diaper changes in chair  []  gravity PEG tube feeding  []  head positioning  []  decrease pain  []  blood pressure management   []  control autonomic dysreflexia  []  decrease respiratory distress  []  user on ventilator     Elevator on mobility base  []  Power wheelchair  []  Scooter  []  increase Indep in transfers   []  increase Indep in ADLs    []  bathroom function and safety  []  kitchen/cooking function and safety  []  shopping  []  raise height for communication at standing level  []  raise height for eye contact which reduces cervical neck strain and pain  []  drive at raised height for safety and navigating crowds  []  Other:   []  Vertical position system  (anterior tilt)     (Drive locks-out)    []  Stand       (Drive enabled)  []  independent weight bearing  []  decrease joint contractures  []  decrease/manage spasticity  []  decrease/manage spasms  []  pressure distribution away from   scapula, sacrum, coccyx, and ischial tuberosity  []  increase digestion and elimination   []  access to counters and cabinets  []   increase reach  []  increase interaction with others at eye level, reduces neck strain  []  increase performance of       MRADL(s)      Power elevating legrest    []  Center mount (Single) 85-170 degrees       []  Standard (Pair) 100-170 degrees  []  position legs at 90 degrees, not available with std power ELR  []  center mount tucks  into chair to decrease turning radius in home, not available with std power ELR  []  provide change in position for LE  []  elevate legs during recline    []  maintain placement of feet on      footplate  []  decrease edema  []  improve circulation  []  actuator needed to elevate legrest  []  actuator needed to articulate legrest preventing knees from flexing  []  Increase ground clearance over      curbs  []   STD (pair) independently                     elevate legrest   POWER WHEELCHAIR CONTROLS      Controls/input device  []  Expandable  []  Non-expandable  []  Proportional  []  Right Hand []  Left Hand  []  Non-proportional/switches/head-array  []  Electrical/proximity         []   Mechanical      Manufacturer:___________________   Type:________________________ []  provides access for controlling wheelchair  []  programming for accurate control  []  progressive disease/changing condition  []  required for alternative drive      controls       []  lacks motor control to operate  proportional drive control  []  unable to understand proportional controls  []  limited movement/strength  []  extraneous movement / tremors / ataxic / spastic       []  Upgraded electronics controller/harness    []  Single power (tilt or recline)   []  Expandable    []  Non-expandable plus   []  Multi-power (tilt, recline, power legrest, power seat lift, vertical positioning system, stand)  []  allows input device to communicate with drive motors  []  harness provides necessary connections between the controller, input device, and seat functions     []  needed in order to operate power seat functions through joystick/ input device  []  required for alternative drive controls     []  Enhanced display  []  required to connect all alternative drive controls   []  required for upgraded joystick      (lite-throw, heavy duty, micro)  []  Allows user to see in which mode and drive the wheelchair is set; necessary for alternate controls        []  Upgraded tracking electronics  []  correct tracking when on uneven surfaces makes switch driving more efficient and less fatiguing  []  increase safety when driving  []  increase ability to traverse thresholds    []  Safety / reset / mode switches     Type:    []  Used to change modes and stop the wheelchair when driving     [x]  Mount for joystick / input device/switches  [x]  swing away for access or transfers   [x]  attaches joystick / input device / switches to wheelchair   []  provides for consistent access  []  midline for optimal placement    []  Attendant controlled joystick plus     mount  []  safety  []  long distance driving  []  operation of seat functions  []  compliance with transportation regulations    [x]  Battery  [x]  required to  power (power assist / scooter/ power wc / other):   []  Power inverter (24V to 12V)  []  required for ventilator / respiratory equipment / other:     CHAIR OPTIONS MANUAL & POWER      Armrests   [x]  adjustable height []  removable  []  swing away []  fixed  [x]  flip back  []  reclining  [x]  full length pads []  desk []  tube arms []  gel pads  [x]  provide support with elbow at 90    [x]  remove/flip back/swing away for  transfers  [x]  provide support and positioning of upper body    []  allow to come closer to table top  []  remove for access to tables  []  provide support for w/c tray  []  change of height/angles for       variable activities   []  Elbow support / Elbow stop  []  keep elbow positioned on arm pad  []  keep arms from falling off arm pad  during tilt and/or recline   Upper Extremity Support  []  Arm trough  []   R  []   L  Style:  []  swivel mount []  fixed mount   []  posterior hand support  []   tray  []  full tray  []  joystick cut out  []   R  []   L  Style:  []  decrease gravitational pull on      shoulders  []  provide support to increase UE  function  []  provide hand support in natural    position  []  position flaccid UE  []  decrease subluxation     []  decrease edema       []  manage spasticity   []  provide midline positioning  []  provide work surface  []  placement for AAC/ Computer/ EADL       Hangers/ Legrests   []  ______ degree  []  Elevating []  articulating  []  swing away []  fixed []  lift off  []  heavy duty []  adjustable knee angle  []  adjustable calf panel   []  longer extension tube              []  provide LE support  []  maintain placement of feet on      footplate   []  accommodate lower leg length  []  accommodate to hamstring       tightness  []  enable transfers  []  provide change in position for LE's  []  elevate legs during recline    []  decrease edema  []  durability      Foot support   []  footplate []  R []  L []  flip up           []  Depth adjustable   []  angle adjustable  []  foot board/one piece    []  provide foot support  []  accommodate to ankle ROM  []  allow foot to go under wheelchair base  []  enable transfers     []  Shoe holders  []  position foot    []  decrease / manage spasticity  []  control position of LE  []  stability    []  safety     []  Ankle strap/heel      loops  []  support foot on foot support  []  decrease extraneous movement  []  provide input to heel   []  protect foot     []  Amputee adapter []  R  []  L     Style:                  Size:  []  Provide support for stump/residual  extremity    []  Transportation tie-down  []  to provide crash tested tie-down brackets    []  Crutch/cane holder    []  O2 holder    []  IV hanger   []  Ventilator tray/mount    []  stabilize accessory on wheelchair       Component  Justification     []  Seat cushion      []  accommodate impaired sensation  []  decubitus ulcers present or history  []  unable to shift weight  []  increase pressure distribution  []  prevent pelvic extension  []  custom required "off-the-shelf"    seat cushion will not accommodate deformity  []  stabilize/promote pelvis alignment  []  stabilize/promote femur alignment  []  accommodate obliquity  []   accommodate multiple deformity  []  incontinent/accidents  []  low maintenance     []  seat mounts                 []  fixed []  removable  []  attach seat platform/cushion to wheelchair frame    []  Seat wedge    []  provide increased aggressiveness of seat shape to decrease sliding  down in the seat  []  accommodate ROM        []  Cover replacement   []  protect back or seat cushion  []  incontinent/accidents    []  Solid seat / insert    []  support cushion to prevent      hammocking  []  allows attachment of cushion to mobility base    []  Lateral pelvic/thigh/hip     support (Guides)     []  decrease abduction  []  accommodate pelvis  []  position upper legs  []  accommodate spasticity  []  removable for transfers     []  Lateral pelvic/thigh      supports mounts  []  fixed   []  swing-away   []  removable  []  mounts lateral pelvic/thigh supports     []  mounts lateral pelvic/thigh supports swing-away or removable for transfers    []  Medial thigh support ([place]mel)  [] decrease adduction  [] accommodate ROM  []  remove for transfers   []  alignment      []  Medial thigh   []  fixed      support mounts      []  swing-away   []  removable  []  mounts medial thigh supports   []  Mounts medial supports swing- away or removable for transfers       Component  Justification   []  Back       []  provide posterior trunk support []  facilitate tone  []  provide lumbar/sacral support []  accommodate deformity  []  support trunk in midline   []  custom required "off-the-shelf" back support will not accommodate deformity   []  provide lateral trunk support []  accommodate or decrease tone            []  Back mounts  []  fixed  []  removable  []  attach back rest/cushion to wheelchair frame   []  Lateral trunk      supports  []  R []  L  []  decrease lateral trunk leaning  []  accommodate asymmetry    []  contour for increased contact  []  safety    []  control of tone    []  Lateral trunk      supports mounts  []  fixed  []  swing-away    []  removable  []  mounts lateral trunk supports     []  Mounts lateral trunk supports swing-away or removable for transfers   []  Anterior chest      strap, vest     []   decrease forward movement of shoulder  []  decrease forward movement of trunk  []  safety/stability  []  added abdominal support  []  trunk alignment  []  assistance with shoulder control   []  decrease shoulder elevation    []  Headrest      []  provide posterior head support  []  provide posterior neck support  []  provide lateral head support  []  provide anterior head support  []  support during tilt and recline  []  improve feeding     []  improve respiration  []  placement of switches  []  safety    []  accommodate ROM   []  accommodate tone  []  improve visual orientation   []  Headrest           []  fixed []  removable []  flip down      Mounting hardware   []  swing-away laterals/switches  []  mount headrest   []  mounts headrest flip down or  removable for transfers  []  mount headrest swing-away laterals   []  mount switches     []  Neck Support    []  decrease neck rotation  []  decrease forward neck flexion   Pelvic Positioner    []  std hip belt          []  padded hip belt  []  dual pull hip belt  []  four point hip belt  []  stabilize tone  []  decrease falling out of chair  []  prevent excessive extension  []  special pull angle to control      rotation  []  pad for protection over boney   prominence  []  promote comfort    []  Essential needs        bag/pouch   []  medicines []  special food rorthotics []  clothing changes  []  diapers  []  catheter/hygiene []  ostomy supplies   The above equipment has a life- long use expectancy.  Growth and changes in medical and/or functional conditions would be the exceptions.   SUMMARY:  Why mobility device was selected; include why a lower level device is not appropriate:   ASSESSMENT:  CLINICAL IMPRESSION: Patient is a 77 y.o. female who was seen today for physical therapy evaluation and  treatment for a power wheelchair with PMH of fibromyalgia and lupus. Patient is currently using a rollator for ambulation; however, patient is non-functional ambulator with this device given severity of pain, endurance limitations, and falls risk. Patient is at a high risk for falls as indicated by TUG score of 74 seconds with this device. Other assistive devices such as a manual wheelchair, scooter, walker, and cane are inadequate to meet patient's needs given pain severity, upper extremity dexterity impairments, and falls risk limitations. Based on this therapists assessment the [facility] Go power wheelchair is a medical necessity in order to meet patient's functional needs at this time. Patient will also benefit from this devices ability to be broken down easily for transportation. This will allow patient to safely move around home, minimize pain, and decrease risk for falls and future hospitalizations while maximizing independence.   OBJECTIVE IMPAIRMENTS Abnormal gait, decreased balance, decreased endurance, difficulty walking, decreased ROM, decreased strength, impaired sensation, and pain.   ACTIVITY LIMITATIONS carrying, standing, transfers, dressing, reach over head, and locomotion level  PARTICIPATION LIMITATIONS: meal prep, cleaning, and community activity  PERSONAL FACTORS Age and 3+ comorbidities: see above  are also affecting patient's functional outcome.   CLINICAL DECISION MAKING: Stable/uncomplicated  EVALUATION COMPLEXITY: Low           GOALS: One time visit. No goals  established.    PLAN: PT FREQUENCY: one time visit  Carmelia Bake, PT, DPT 11/09/2022, 10:09 AM    I concur with the above findings and recommendations of the therapist:  Physician name printed:         Physician's signature:      Date:

## 2023-03-12 ENCOUNTER — Encounter (HOSPITAL_BASED_OUTPATIENT_CLINIC_OR_DEPARTMENT_OTHER): Payer: Self-pay | Admitting: Emergency Medicine

## 2023-03-12 ENCOUNTER — Emergency Department (HOSPITAL_COMMUNITY): Payer: HMO

## 2023-03-12 ENCOUNTER — Emergency Department (HOSPITAL_BASED_OUTPATIENT_CLINIC_OR_DEPARTMENT_OTHER)
Admission: EM | Admit: 2023-03-12 | Discharge: 2023-03-12 | Disposition: A | Discharge status: Home / Self Care | Payer: HMO | Attending: Emergency Medicine | Admitting: Emergency Medicine

## 2023-03-12 ENCOUNTER — Emergency Department (HOSPITAL_BASED_OUTPATIENT_CLINIC_OR_DEPARTMENT_OTHER): Payer: HMO

## 2023-03-12 ENCOUNTER — Other Ambulatory Visit: Payer: Self-pay

## 2023-03-12 DIAGNOSIS — W19XXXA Unspecified fall, initial encounter: Secondary | ICD-10-CM | POA: Diagnosis not present

## 2023-03-12 DIAGNOSIS — R400 Somnolence: Secondary | ICD-10-CM

## 2023-03-12 DIAGNOSIS — I10 Essential (primary) hypertension: Secondary | ICD-10-CM | POA: Diagnosis not present

## 2023-03-12 DIAGNOSIS — M546 Pain in thoracic spine: Secondary | ICD-10-CM | POA: Diagnosis not present

## 2023-03-12 DIAGNOSIS — Z7982 Long term (current) use of aspirin: Secondary | ICD-10-CM | POA: Insufficient documentation

## 2023-03-12 DIAGNOSIS — Z8673 Personal history of transient ischemic attack (TIA), and cerebral infarction without residual deficits: Secondary | ICD-10-CM | POA: Diagnosis not present

## 2023-03-12 DIAGNOSIS — Z79899 Other long term (current) drug therapy: Secondary | ICD-10-CM | POA: Insufficient documentation

## 2023-03-12 DIAGNOSIS — R42 Dizziness and giddiness: Secondary | ICD-10-CM | POA: Diagnosis not present

## 2023-03-12 LAB — CBC
HCT: 34 % — ABNORMAL LOW (ref 36.0–46.0)
Hemoglobin: 11.2 g/dL — ABNORMAL LOW (ref 12.0–15.0)
MCH: 29.6 pg (ref 26.0–34.0)
MCHC: 32.9 g/dL (ref 30.0–36.0)
MCV: 89.9 fL (ref 80.0–100.0)
Platelets: 175 10*3/uL (ref 150–400)
RBC: 3.78 MIL/uL — ABNORMAL LOW (ref 3.87–5.11)
RDW: 14 % (ref 11.5–15.5)
WBC: 4.7 10*3/uL (ref 4.0–10.5)
nRBC: 0 % (ref 0.0–0.2)

## 2023-03-12 LAB — URINALYSIS, W/ REFLEX TO CULTURE (INFECTION SUSPECTED)
Bilirubin Urine: NEGATIVE
Glucose, UA: NEGATIVE mg/dL
Ketones, ur: NEGATIVE mg/dL
Leukocytes,Ua: NEGATIVE
Nitrite: NEGATIVE
Protein, ur: NEGATIVE mg/dL
Specific Gravity, Urine: 1.007 (ref 1.005–1.030)
pH: 5 (ref 5.0–8.0)

## 2023-03-12 LAB — BASIC METABOLIC PANEL
Anion gap: 9 (ref 5–15)
BUN: 32 mg/dL — ABNORMAL HIGH (ref 8–23)
CO2: 28 mmol/L (ref 22–32)
Calcium: 9.3 mg/dL (ref 8.9–10.3)
Chloride: 103 mmol/L (ref 98–111)
Creatinine, Ser: 1.69 mg/dL — ABNORMAL HIGH (ref 0.44–1.00)
GFR, Estimated: 31 mL/min — ABNORMAL LOW (ref >60)
Glucose, Bld: 107 mg/dL — ABNORMAL HIGH (ref 70–99)
Potassium: 3.7 mmol/L (ref 3.5–5.1)
Sodium: 140 mmol/L (ref 135–145)

## 2023-03-12 LAB — TROPONIN I (HIGH SENSITIVITY)
Troponin I (High Sensitivity): 5 ng/L (ref <18)
Troponin I (High Sensitivity): 5 ng/L (ref <18)

## 2023-03-12 NOTE — ED Provider Notes (Signed)
2:47 PM patient seen family at bedside.  Patient was at [facility] and sent for MRI.  Patient has had increased somnolence and dizziness, especially when she gets up and moves around.  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  She was sent for MRI.  MRI has resulted and does not show an acute stroke.  Shows small chronic left cerebellar infarct.  Patient states that overall she is feeling well.  Currently she is not somnolent.  Family states that she is at baseline.  Patient and family are comfortable with following up with PCP for further evaluation.  Encouraged to return to the emergency department with any strokelike symptoms, inability to walk, altered mental status or other concerns.  I reviewed her workup from earlier this morning, CBC with mild anemia, normal white blood cell count; BMP with creatinine 1.69 history of chronic kidney disease, BUN 32, glucose 107 otherwise unremarkable; UA without signs of infection; troponin normal.  BP 131/71 (BP Location: Left Arm)   Pulse 75   Temp 97.8 F (36.6 C)   Resp 15   SpO2 100%     Renne Crigler, PA-C 03/12/23 1449    Sloan Leiter, DO 03/13/23 1713

## 2023-03-12 NOTE — ED Notes (Signed)
Pt ambulated with walker at baseline and standy-by assist. Pt reporting dizziness/lightheadedness upon standing. Reporting that she is seeing double upon standing. Reports hx of vertigo. Dr. Eudelia Bunch notified

## 2023-03-12 NOTE — Discharge Instructions (Addendum)
Your lab workup appear to be at baseline and your MRI did not show any acute stroke.  You have an old stroke in your cerebellum.  This does not require admission today.  Please follow-up with your primary care doctor in the next 3 to 5 days for further evaluation of your symptoms.

## 2023-03-12 NOTE — ED Notes (Signed)
Report given to [facility].

## 2023-03-12 NOTE — ED Notes (Signed)
Pt wheeled to waiting room by family. Pt verbalized understanding of discharge instructions.

## 2023-03-12 NOTE — ED Notes (Signed)
Pt woken up from sleep... Pt was easy to wake, Pt talked to about the complaint that brought her in... Pt stated that she had a fall a few days ago, struck her head (not on a blood thinner), possible LOC... Ever since has been having moments of falling asleep "passing out" - according to family and being more difficult to wake up... Pt also complained of left shoulder pain... No current N/V, CP, SOB to note... Pt had no obvious numbness/tingling/ inability to move body parts/sight problems\ or any other obvious signs of a neuro issue.Marland KitchenMarland Kitchen

## 2023-03-12 NOTE — ED Notes (Signed)
Report given to [facility] 

## 2023-03-12 NOTE — ED Provider Notes (Addendum)
[place] EMERGENCY DEPARTMENT AT [facility] Provider Note  CSN: [id number] Arrival date & time: 03/12/23 8295  Chief Complaint(s) Fall and Dizziness  HPI Alexandra James is a 77 y.o. female with a past medical history listed below who was brought in by family due to increased somnolence throughout the day.  The patient reportedly had a fall approximately 24 hours ago resulting in head trauma.  No reported loss of consciousness.  Patient remained in her room all day yesterday and slept for most of the day.  Patient has been complaining of increased dizziness.  She also reported right-sided sharp shoulder girdle intermittent pains consistent with her muscular spasm/fibromyalgia pain.  No overt chest pain or shortness of breath.  No nausea or vomiting.  No urinary symptoms.  No diarrhea.  Family reports that the patient manages her own medication at home.  Patient denies take any additional doses of her medication.    The history is provided by the patient.    Past Medical History Past Medical History:  Diagnosis Date   Baastrup's syndrome    Chronic pain    DDD (degenerative disc disease), cervical    DVT (deep venous thrombosis) (HCC)    Fibromyalgia    Hypertension    Lupus    Spinal stenosis    There are no problems to display for this patient.  Home Medication(s) Prior to Admission medications   Medication Sig Start Date End Date Taking? Authorizing Provider  aspirin 81 MG chewable tablet Chew by mouth.    [provider]  atorvastatin (LIPITOR) 20 MG tablet Take 20 mg by mouth daily. 04/06/21   [provider]  buPROPion (WELLBUTRIN XL) 300 MG 24 hr tablet Take by mouth. 01/22/21 10/18/23  [provider]  Cholecalciferol 125 MCG (5000 UT) capsule Take 1 capsule by mouth daily.    [provider]  furosemide (LASIX) 20 MG tablet Take 20 mg by mouth daily as needed. 01/01/21   [provider]  gabapentin (NEURONTIN) 300 MG  capsule Take 300 mg by mouth 2 (two) times daily. 04/13/21   [provider]  hydrochlorothiazide (HYDRODIURIL) 12.5 MG tablet Take 1 tablet by mouth daily. 04/09/21   [provider]  metoprolol succinate (TOPROL-XL) 50 MG 24 hr tablet Take 1 tablet by mouth daily. 01/30/21   [provider]  omeprazole (PRILOSEC) 20 MG capsule Take 1 tablet by mouth daily. 01/31/21   [provider]                                                                                                                                    Allergies Clindamycin/lincomycin, Penicillins, and Amoxicillin  Review of Systems Review of Systems As noted in HPI  Physical Exam Vital Signs  I have reviewed the triage vital signs BP 122/69   Pulse 77   Temp 97.7 F (36.5 C) (Oral)   Resp 12  SpO2 99%   Physical Exam Vitals reviewed.  Constitutional:      General: She is not in acute distress.    Appearance: She is well-developed. She is not diaphoretic.  HENT:     Head: Normocephalic and atraumatic.     Nose: Nose normal.  Eyes:     General: No scleral icterus.       Right eye: No discharge.        Left eye: No discharge.     Conjunctiva/sclera: Conjunctivae normal.     Pupils: Pupils are equal, round, and reactive to light.  Cardiovascular:     Rate and Rhythm: Normal rate and regular rhythm.     Heart sounds: No murmur heard.    No friction rub. No gallop.  Pulmonary:     Effort: Pulmonary effort is normal. No respiratory distress.     Breath sounds: Normal breath sounds. No stridor. No rales.  Chest:     Chest wall: Tenderness present.    Abdominal:     General: There is no distension.     Palpations: Abdomen is soft.     Tenderness: There is no abdominal tenderness.  Musculoskeletal:     Cervical back: Normal range of motion and neck supple.     Thoracic back: Tenderness present.       Back:  Skin:    General: Skin is warm and dry.     Findings: No erythema or  rash.  Neurological:     Mental Status: She is oriented to person, place, and time. She is lethargic.     Comments: Somnolent and initially difficult to arouse but able to converse and oriented x 4 once awake. Moves all extremities and follows commands.     ED Results and Treatments Labs (all labs ordered are listed, but only abnormal results are displayed) Labs Reviewed  BASIC METABOLIC PANEL - Abnormal; Notable for the following components:      Result Value   Glucose, Bld 107 (*)    BUN 32 (*)    Creatinine, Ser 1.69 (*)    GFR, Estimated 31 (*)    All other components within normal limits  CBC - Abnormal; Notable for the following components:   RBC 3.78 (*)    Hemoglobin 11.2 (*)    HCT 34.0 (*)    All other components within normal limits  URINALYSIS, W/ REFLEX TO CULTURE (INFECTION SUSPECTED) - Abnormal; Notable for the following components:   Color, Urine COLORLESS (*)    Hgb urine dipstick MODERATE (*)    Bacteria, UA RARE (*)    All other components within normal limits  TROPONIN I (HIGH SENSITIVITY)  TROPONIN I (HIGH SENSITIVITY)                                                                                                                         EKG  EKG Interpretation Date/Time:  Friday March 12 2023 02:48:42 EDT Ventricular Rate:  78 PR Interval:  QRS Duration:  119 QT Interval:  442 QTC Calculation: 504 R Axis:   -51  Text Interpretation: Sinus rhythm Incomplete left bundle branch block Probable left ventricular hypertrophy Anterior Q waves, possibly due to LVH No significant change since last tracing Confirmed by Drema Pry [phone]) on 03/12/2023 3:49:35 AM       Radiology CT Head Wo Contrast  Result Date: 03/12/2023 CLINICAL DATA:  Minor head trauma.  Lethargy and lightheadedness. EXAM: CT HEAD WITHOUT CONTRAST TECHNIQUE: Contiguous axial images were obtained from the base of the skull through the vertex without intravenous contrast.  RADIATION DOSE REDUCTION: This exam was performed according to the departmental dose-optimization program which includes automated exposure control, adjustment of the mA and/or kV according to patient size and/or use of iterative reconstruction technique. COMPARISON:  None Available. FINDINGS: Brain: No evidence of acute infarction, hemorrhage, hydrocephalus, extra-axial collection or mass lesion/mass effect. Small chronic left cerebellar infarct. Mild for age cerebral volume loss that is generalized. Vascular: No hyperdense vessel or unexpected calcification. Skull: Normal. Negative for fracture or focal lesion. Sinuses/Orbits: No acute finding. IMPRESSION: No acute finding. Small, chronic left cerebellar infarct. Electronically Signed   By: Tiburcio Pea M.D.   On: 03/12/2023 04:44   DG Chest Portable 1 View  Result Date: 03/12/2023 CLINICAL DATA:  Chest pain, lightheadedness, and lethargy. This morning the patient was sitting on the bed and kept falling asleep and fell to the floor. Head injury. EXAM: PORTABLE CHEST 1 VIEW COMPARISON:  05/09/2021 FINDINGS: Shallow inspiration with linear atelectasis in the left lung base, similar to prior study. Cardiac enlargement. No vascular congestion or edema. No pleural effusions. No pneumothorax. Tortuous aorta. Degenerative changes in the spine and shoulders. IMPRESSION: Shallow inspiration with atelectasis in the left base. Cardiac enlargement. Electronically Signed   By: Burman Nieves M.D.   On: 03/12/2023 03:17    Medications Ordered in ED Medications - No data to display Procedures Procedures  (including critical care time) Medical Decision Making / ED Course   Medical Decision Making Amount and/or Complexity of Data Reviewed Labs: ordered. Decision-making details documented in ED Course. Radiology: ordered and independent interpretation performed. Decision-making details documented in ED Course. ECG/medicine tests: ordered and independent  interpretation performed. Decision-making details documented in ED Course.    Will need to assess for any evidence of infection, electrolyte derangements or metabolic derangements.  Will obtain a CT of the head to rule out ICH, mass effect or evidence of CVA.  Will obtain cardiac workup. Also considering somnolence from sedating meds.  EKG without acute ischemic changes.  Serial troponins negative x 2.  Chest pain is most consistent with muscular pain.  Unlikely ACS. Chest x-ray without evidence of pneumonia, pneumothorax, pulmonary edema pleural effusions. CBC without leukocytosis.  Mild anemia. Metabolic panel without significant electrolyte derangements.  Mild renal insufficiencyfrom baseline without AKI. UA without evidence of infection  CT head negative for ICH, mass effect but it did reveal evidence of a remote left cerebellar stroke which patient nor the family were aware of.  Will obtain MRI to rule out acute stroke.  Need to transport to [facility].     Final Clinical Impression(s) / ED Diagnoses Final diagnoses:  Fall, initial encounter  Dizziness  Somnolence  Remote history of stroke    This chart was dictated using voice recognition software.  Despite best efforts to proofread,  errors can occur which can change the documentation meaning.      Nira Conn, MD 03/12/23 [phone]

## 2023-03-12 NOTE — ED Triage Notes (Signed)
Pt c/o lightheadedness and lethargy recently. Pt reports that she was sitting on the side of the bed this morning and kept falling asleep and fell onto fllor. Reports head injury, denies LOC, denies blood thinners. Pt A&O x4 in triage.  Pt also c/o intermittent sharp CP. Pt reports that she cannot specify location in chest. Cannot specify worsening and alleviating factors

## 2023-03-12 NOTE — ED Notes (Signed)
[facility] called for transport

## 2023-03-14 NOTE — Plan of Care (Signed)
[facility] Tonsillectomy/Adenoidectomy, Postoperative PEDS care plan entered in error.
